# Patient Record
Sex: Female | Born: 1955 | Race: White | Hispanic: No | Marital: Married | Smoking: Never smoker
Health system: Southern US, Community
[De-identification: ages and names within clinical notes are randomized; demographics above are authoritative.]

## PROBLEM LIST (undated history)

## (undated) DIAGNOSIS — G4700 Insomnia, unspecified: Secondary | ICD-10-CM

## (undated) DIAGNOSIS — I341 Nonrheumatic mitral (valve) prolapse: Secondary | ICD-10-CM

## (undated) HISTORY — PX: SHOULDER SURGERY: SHX246

## (undated) HISTORY — DX: Insomnia, unspecified: G47.00

## (undated) HISTORY — DX: Nonrheumatic mitral (valve) prolapse: I34.1

## (undated) HISTORY — PX: BUNIONECTOMY: SHX129

---

## 2000-05-28 ENCOUNTER — Emergency Department (HOSPITAL_COMMUNITY): Admission: EM | Admit: 2000-05-28 | Discharge: 2000-05-28 | Payer: Self-pay | Admitting: Emergency Medicine

## 2004-02-09 ENCOUNTER — Ambulatory Visit: Payer: Self-pay

## 2005-02-15 ENCOUNTER — Ambulatory Visit: Payer: Self-pay

## 2006-02-27 ENCOUNTER — Ambulatory Visit: Payer: Self-pay

## 2007-03-05 ENCOUNTER — Ambulatory Visit: Payer: Self-pay

## 2007-12-25 ENCOUNTER — Ambulatory Visit: Payer: Self-pay

## 2008-03-09 ENCOUNTER — Ambulatory Visit: Payer: Self-pay

## 2009-03-10 ENCOUNTER — Ambulatory Visit: Payer: Self-pay

## 2010-03-14 ENCOUNTER — Ambulatory Visit: Payer: Self-pay | Admitting: Gastroenterology

## 2010-03-15 ENCOUNTER — Ambulatory Visit: Payer: Self-pay

## 2011-03-29 ENCOUNTER — Ambulatory Visit: Payer: Self-pay

## 2011-10-05 ENCOUNTER — Emergency Department: Payer: Self-pay | Admitting: Internal Medicine

## 2011-10-10 ENCOUNTER — Ambulatory Visit: Payer: Self-pay | Admitting: Internal Medicine

## 2015-04-08 ENCOUNTER — Other Ambulatory Visit: Payer: Self-pay | Admitting: Obstetrics and Gynecology

## 2015-04-08 ENCOUNTER — Inpatient Hospital Stay
Admission: RE | Admit: 2015-04-08 | Discharge: 2015-04-08 | Disposition: A | Discharge status: Home / Self Care | Payer: Self-pay | Source: Ambulatory Visit | Attending: *Deleted | Admitting: *Deleted

## 2015-04-08 ENCOUNTER — Other Ambulatory Visit: Payer: Self-pay | Admitting: *Deleted

## 2015-04-08 DIAGNOSIS — R928 Other abnormal and inconclusive findings on diagnostic imaging of breast: Secondary | ICD-10-CM

## 2015-04-08 DIAGNOSIS — Z9289 Personal history of other medical treatment: Secondary | ICD-10-CM

## 2015-04-22 ENCOUNTER — Ambulatory Visit
Admission: RE | Admit: 2015-04-22 | Discharge: 2015-04-22 | Disposition: A | Discharge status: Home / Self Care | Payer: 59 | Source: Ambulatory Visit | Attending: Obstetrics and Gynecology | Admitting: Obstetrics and Gynecology

## 2015-04-22 DIAGNOSIS — R928 Other abnormal and inconclusive findings on diagnostic imaging of breast: Secondary | ICD-10-CM | POA: Diagnosis not present

## 2016-02-15 ENCOUNTER — Other Ambulatory Visit: Payer: Self-pay | Admitting: Obstetrics and Gynecology

## 2016-02-15 DIAGNOSIS — Z1231 Encounter for screening mammogram for malignant neoplasm of breast: Secondary | ICD-10-CM

## 2016-04-12 ENCOUNTER — Ambulatory Visit
Admission: RE | Admit: 2016-04-12 | Discharge: 2016-04-12 | Disposition: A | Discharge status: Home / Self Care | Payer: 59 | Source: Ambulatory Visit | Attending: Obstetrics and Gynecology | Admitting: Obstetrics and Gynecology

## 2016-04-12 DIAGNOSIS — Z1231 Encounter for screening mammogram for malignant neoplasm of breast: Secondary | ICD-10-CM | POA: Insufficient documentation

## 2016-10-06 ENCOUNTER — Other Ambulatory Visit: Payer: Self-pay | Admitting: Internal Medicine

## 2016-10-06 DIAGNOSIS — I10 Essential (primary) hypertension: Secondary | ICD-10-CM | POA: Diagnosis not present

## 2016-10-06 DIAGNOSIS — I471 Supraventricular tachycardia: Secondary | ICD-10-CM | POA: Diagnosis not present

## 2016-10-06 DIAGNOSIS — W57XXXA Bitten or stung by nonvenomous insect and other nonvenomous arthropods, initial encounter: Secondary | ICD-10-CM | POA: Diagnosis not present

## 2016-10-06 DIAGNOSIS — Z7689 Persons encountering health services in other specified circumstances: Secondary | ICD-10-CM | POA: Diagnosis not present

## 2016-10-06 DIAGNOSIS — Z79899 Other long term (current) drug therapy: Secondary | ICD-10-CM | POA: Diagnosis not present

## 2016-10-06 DIAGNOSIS — K219 Gastro-esophageal reflux disease without esophagitis: Secondary | ICD-10-CM

## 2016-10-06 DIAGNOSIS — Z0000 Encounter for general adult medical examination without abnormal findings: Secondary | ICD-10-CM | POA: Diagnosis not present

## 2016-10-13 ENCOUNTER — Ambulatory Visit
Admission: RE | Admit: 2016-10-13 | Discharge: 2016-10-13 | Disposition: A | Discharge status: Home / Self Care | Payer: 59 | Source: Ambulatory Visit | Attending: Internal Medicine | Admitting: Internal Medicine

## 2016-10-13 DIAGNOSIS — K449 Diaphragmatic hernia without obstruction or gangrene: Secondary | ICD-10-CM | POA: Diagnosis not present

## 2016-10-13 DIAGNOSIS — K8020 Calculus of gallbladder without cholecystitis without obstruction: Secondary | ICD-10-CM | POA: Insufficient documentation

## 2016-10-13 DIAGNOSIS — K219 Gastro-esophageal reflux disease without esophagitis: Secondary | ICD-10-CM | POA: Diagnosis not present

## 2016-11-09 DIAGNOSIS — J301 Allergic rhinitis due to pollen: Secondary | ICD-10-CM | POA: Diagnosis not present

## 2016-11-09 DIAGNOSIS — J309 Allergic rhinitis, unspecified: Secondary | ICD-10-CM | POA: Diagnosis not present

## 2016-11-09 DIAGNOSIS — R05 Cough: Secondary | ICD-10-CM | POA: Diagnosis not present

## 2016-11-15 DIAGNOSIS — K219 Gastro-esophageal reflux disease without esophagitis: Secondary | ICD-10-CM | POA: Diagnosis not present

## 2016-11-15 DIAGNOSIS — R1310 Dysphagia, unspecified: Secondary | ICD-10-CM | POA: Diagnosis not present

## 2016-12-26 DIAGNOSIS — K449 Diaphragmatic hernia without obstruction or gangrene: Secondary | ICD-10-CM | POA: Diagnosis not present

## 2016-12-26 DIAGNOSIS — K210 Gastro-esophageal reflux disease with esophagitis: Secondary | ICD-10-CM | POA: Diagnosis not present

## 2016-12-26 DIAGNOSIS — K2980 Duodenitis without bleeding: Secondary | ICD-10-CM | POA: Diagnosis not present

## 2016-12-26 DIAGNOSIS — R1310 Dysphagia, unspecified: Secondary | ICD-10-CM | POA: Diagnosis not present

## 2017-01-16 DIAGNOSIS — Z23 Encounter for immunization: Secondary | ICD-10-CM | POA: Diagnosis not present

## 2017-02-12 DIAGNOSIS — K210 Gastro-esophageal reflux disease with esophagitis: Secondary | ICD-10-CM | POA: Diagnosis not present

## 2017-02-12 DIAGNOSIS — K2980 Duodenitis without bleeding: Secondary | ICD-10-CM | POA: Diagnosis not present

## 2017-02-14 DIAGNOSIS — R197 Diarrhea, unspecified: Secondary | ICD-10-CM | POA: Diagnosis not present

## 2017-02-14 DIAGNOSIS — Z91018 Allergy to other foods: Secondary | ICD-10-CM | POA: Diagnosis not present

## 2017-02-14 DIAGNOSIS — R1110 Vomiting, unspecified: Secondary | ICD-10-CM | POA: Diagnosis not present

## 2017-03-06 ENCOUNTER — Other Ambulatory Visit: Payer: Self-pay | Admitting: Obstetrics and Gynecology

## 2017-03-06 DIAGNOSIS — Z1231 Encounter for screening mammogram for malignant neoplasm of breast: Secondary | ICD-10-CM

## 2017-04-06 DIAGNOSIS — S99921A Unspecified injury of right foot, initial encounter: Secondary | ICD-10-CM | POA: Diagnosis not present

## 2017-04-11 ENCOUNTER — Encounter: Payer: Self-pay | Admitting: Obstetrics and Gynecology

## 2017-04-11 ENCOUNTER — Ambulatory Visit (INDEPENDENT_AMBULATORY_CARE_PROVIDER_SITE_OTHER): Payer: 59 | Admitting: Obstetrics and Gynecology

## 2017-04-11 VITALS — BP 130/84 | Ht 67.0 in | Wt 199.0 lb

## 2017-04-11 DIAGNOSIS — Z124 Encounter for screening for malignant neoplasm of cervix: Secondary | ICD-10-CM

## 2017-04-11 DIAGNOSIS — Z1151 Encounter for screening for human papillomavirus (HPV): Secondary | ICD-10-CM

## 2017-04-11 DIAGNOSIS — Z01419 Encounter for gynecological examination (general) (routine) without abnormal findings: Secondary | ICD-10-CM

## 2017-04-11 DIAGNOSIS — Z1231 Encounter for screening mammogram for malignant neoplasm of breast: Secondary | ICD-10-CM | POA: Diagnosis not present

## 2017-04-11 DIAGNOSIS — Z1239 Encounter for other screening for malignant neoplasm of breast: Secondary | ICD-10-CM

## 2017-04-11 NOTE — Patient Instructions (Signed)
I value your feedback and entrusting us with your care. If you get a [facility] patient survey, I would appreciate you taking the time to let us know about your experience today. Thank you! 

## 2017-04-11 NOTE — Progress Notes (Signed)
PCP: Marguarite ArbourSparks, Jeffrey D, MD   Chief Complaint  Patient presents with  . Gynecologic Exam    HPI:      [name] is a 61 y.o. No obstetric history on file. who LMP was No LMP recorded. Patient is postmenopausal., presents today for her annual examination.  Her menses are absent due to menopause. She does not have intermenstrual bleeding.  She does have occas vasomotor sx.  Sex activity: single partner, contraception - post menopausal status. She does not have vaginal dryness.  Last Pap: April 02, 2014  Results were: no abnormalities /neg HPV DNA.  Hx of STDs: none  Last mammogram: April 12, 2016  Results were: normal--routine follow-up in 12 months There is no FH of breast cancer. There is no FH of ovarian cancer. The patient does do self-breast exams.  Colonoscopy: colonoscopy 6 years ago without abnormalities. Repeat due after 10 years.   Tobacco use: The patient denies current or previous tobacco use. Alcohol use: social drinker Exercise: moderately active  She does get adequate calcium and Vitamin D in her diet.  Labs with PCP.  Past Medical History:  Diagnosis Date  . Insomnia   . Mitral valve prolapse     Past Surgical History:  Procedure Laterality Date  . BUNIONECTOMY    . SHOULDER SURGERY     arthroscopic    Family History  Problem Relation Age of Onset  . Osteoporosis Mother   . Breast cancer Neg Hx     Social History   Socioeconomic History  . Marital status: Married    Spouse name: Not on file  . Number of children: Not on file  . Years of education: Not on file  . Highest education level: Not on file  Social Needs  . Financial resource strain: Not on file  . Food insecurity - worry: Not on file  . Food insecurity - inability: Not on file  . Transportation needs - medical: Not on file  . Transportation needs - non-medical: Not on file  Occupational History  . Not on file  Tobacco Use  . Smoking status: Never Smoker  .  Smokeless tobacco: Never Used  Substance and Sexual Activity  . Alcohol use: Yes    Comment: occasional  . Drug use: No  . Sexual activity: Yes    Birth control/protection: Post-menopausal  Other Topics Concern  . Not on file  Social History Narrative  . Not on file    Current Meds  Medication Sig  . albuterol (PROVENTIL HFA) 108 (90 Base) MCG/ACT inhaler INHALE 1-2 PUFFS AS NEEDED EVERY 4-6 HOURS AS NEEDED FOR COUGH/WHEEZING  . Calcium Carbonate-Vitamin D3 (CALCIUM 600-D) 600-400 MG-UNIT TABS Take by mouth.  . Cholecalciferol (VITAMIN D3) 3000 units TABS Take by mouth.  Marland Kitchen. FLUCELVAX QUADRIVALENT 0.5 ML SUSY TO BE ADMINISTERED BY PHARMACIST FOR IMMUNIZATION  . fluticasone (FLONASE) 50 MCG/ACT nasal spray USE 1-2 SPRAYS IN EACH NOSTRIL ONCE A DAY  . folic acid (FOLVITE) 400 MCG tablet Take by mouth.  . metoprolol succinate (TOPROL-XL) 50 MG 24 hr tablet Take 50 mg by mouth.  . Multiple Vitamins-Minerals (MULTIVITAMIN ADULT PO) Take by mouth.  . pantoprazole (PROTONIX) 40 MG tablet Take 40 mg by mouth.  . ranitidine (ZANTAC) 300 MG tablet TAKE 1 TABLET BY MOUTH EVERY DAY AT NIGHT  . zolpidem (AMBIEN CR) 6.25 MG CR tablet       ROS:  Review of Systems  Constitutional: Negative for fatigue, fever and unexpected  weight change.  Respiratory: Negative for cough, shortness of breath and wheezing.   Cardiovascular: Negative for chest pain, palpitations and leg swelling.  Gastrointestinal: Negative for blood in stool, constipation, diarrhea, nausea and vomiting.  Endocrine: Negative for cold intolerance, heat intolerance and polyuria.  Genitourinary: Negative for dyspareunia, dysuria, flank pain, frequency, genital sores, hematuria, menstrual problem, pelvic pain, urgency, vaginal bleeding, vaginal discharge and vaginal pain.  Musculoskeletal: Negative for back pain, joint swelling and myalgias.  Skin: Negative for rash.  Neurological: Negative for dizziness, syncope, light-headedness,  numbness and headaches.  Hematological: Negative for adenopathy.  Psychiatric/Behavioral: Negative for agitation, confusion, sleep disturbance and suicidal ideas. The patient is not nervous/anxious.      Objective: BP 130/84   Ht 5\' 7"  (1.702 m)   Wt 199 lb (90.3 kg)   BMI 31.17 kg/m    Physical Exam  Constitutional: She is oriented to person, place, and time. She appears well-developed and well-nourished.  Genitourinary: Vagina normal and uterus normal. There is no rash or tenderness on the right labia. There is no rash or tenderness on the left labia. No erythema or tenderness in the vagina. No vaginal discharge found. Right adnexum does not display mass and does not display tenderness. Left adnexum does not display mass and does not display tenderness. Cervix does not exhibit motion tenderness or polyp. Uterus is not enlarged or tender.  Neck: Normal range of motion. No thyromegaly present.  Cardiovascular: Normal rate, regular rhythm and normal heart sounds.  No murmur heard. Pulmonary/Chest: Effort normal and breath sounds normal. Right breast exhibits no mass, no nipple discharge, no skin change and no tenderness. Left breast exhibits no mass, no nipple discharge, no skin change and no tenderness.  Abdominal: Soft. There is no tenderness. There is no guarding.  Musculoskeletal: Normal range of motion.  Neurological: She is alert and oriented to person, place, and time. No cranial nerve deficit.  Psychiatric: She has a normal mood and affect. Her behavior is normal.  Vitals reviewed.   Assessment/Plan:  Encounter for annual routine gynecological examination  Cervical cancer screening - Plan: IGP, Aptima HPV  Screening for HPV (human papillomavirus) - Plan: IGP, Aptima HPV  Screening for breast cancer - Pt has mammo sched 12/28. - Plan: MM SCREENING BREAST TOMO BILATERAL         GYN counsel breast self exam, mammography screening, adequate intake of calcium and vitamin D,  diet and exercise    F/U  Return in about 1 year (around 04/11/2018).  Alicia B. Copland, PA-C 04/11/2017 2:13 PM

## 2017-04-13 ENCOUNTER — Ambulatory Visit
Admission: RE | Admit: 2017-04-13 | Discharge: 2017-04-13 | Disposition: A | Discharge status: Home / Self Care | Payer: 59 | Source: Ambulatory Visit | Attending: Obstetrics and Gynecology | Admitting: Obstetrics and Gynecology

## 2017-04-13 DIAGNOSIS — Z1231 Encounter for screening mammogram for malignant neoplasm of breast: Secondary | ICD-10-CM

## 2017-04-13 LAB — IGP, APTIMA HPV
HPV APTIMA: NEGATIVE
PAP SMEAR COMMENT: 0

## 2017-04-15 ENCOUNTER — Encounter: Payer: Self-pay | Admitting: Obstetrics and Gynecology

## 2017-05-28 DIAGNOSIS — Z91018 Allergy to other foods: Secondary | ICD-10-CM | POA: Diagnosis not present

## 2017-06-08 DIAGNOSIS — L821 Other seborrheic keratosis: Secondary | ICD-10-CM | POA: Diagnosis not present

## 2017-06-12 DIAGNOSIS — J301 Allergic rhinitis due to pollen: Secondary | ICD-10-CM | POA: Diagnosis not present

## 2017-06-12 DIAGNOSIS — R05 Cough: Secondary | ICD-10-CM | POA: Diagnosis not present

## 2017-06-12 DIAGNOSIS — Z91030 Bee allergy status: Secondary | ICD-10-CM | POA: Diagnosis not present

## 2017-10-04 DIAGNOSIS — E7800 Pure hypercholesterolemia, unspecified: Secondary | ICD-10-CM | POA: Diagnosis not present

## 2017-10-04 DIAGNOSIS — I471 Supraventricular tachycardia: Secondary | ICD-10-CM | POA: Diagnosis not present

## 2017-10-04 DIAGNOSIS — I10 Essential (primary) hypertension: Secondary | ICD-10-CM | POA: Diagnosis not present

## 2017-10-05 DIAGNOSIS — Z79899 Other long term (current) drug therapy: Secondary | ICD-10-CM | POA: Diagnosis not present

## 2017-10-05 DIAGNOSIS — E7800 Pure hypercholesterolemia, unspecified: Secondary | ICD-10-CM | POA: Diagnosis not present

## 2017-10-05 DIAGNOSIS — I10 Essential (primary) hypertension: Secondary | ICD-10-CM | POA: Diagnosis not present

## 2017-10-09 DIAGNOSIS — Z13820 Encounter for screening for osteoporosis: Secondary | ICD-10-CM | POA: Diagnosis not present

## 2017-12-31 DIAGNOSIS — Z23 Encounter for immunization: Secondary | ICD-10-CM | POA: Diagnosis not present

## 2018-02-28 DIAGNOSIS — Z23 Encounter for immunization: Secondary | ICD-10-CM | POA: Diagnosis not present

## 2018-03-01 ENCOUNTER — Other Ambulatory Visit: Payer: Self-pay | Admitting: Obstetrics and Gynecology

## 2018-03-01 DIAGNOSIS — Z1231 Encounter for screening mammogram for malignant neoplasm of breast: Secondary | ICD-10-CM

## 2018-04-15 ENCOUNTER — Ambulatory Visit
Admission: RE | Admit: 2018-04-15 | Discharge: 2018-04-15 | Disposition: A | Discharge status: Home / Self Care | Payer: 59 | Source: Ambulatory Visit | Attending: Obstetrics and Gynecology | Admitting: Obstetrics and Gynecology

## 2018-04-15 DIAGNOSIS — Z1231 Encounter for screening mammogram for malignant neoplasm of breast: Secondary | ICD-10-CM | POA: Diagnosis present

## 2018-04-17 ENCOUNTER — Encounter: Payer: Self-pay | Admitting: Obstetrics and Gynecology

## 2018-04-18 ENCOUNTER — Encounter: Payer: Self-pay | Admitting: Obstetrics and Gynecology

## 2018-04-18 ENCOUNTER — Ambulatory Visit (INDEPENDENT_AMBULATORY_CARE_PROVIDER_SITE_OTHER): Payer: 59 | Admitting: Obstetrics and Gynecology

## 2018-04-18 VITALS — BP 120/80 | HR 64 | Ht 66.0 in | Wt 194.0 lb

## 2018-04-18 DIAGNOSIS — Z1239 Encounter for other screening for malignant neoplasm of breast: Secondary | ICD-10-CM

## 2018-04-18 DIAGNOSIS — K644 Residual hemorrhoidal skin tags: Secondary | ICD-10-CM

## 2018-04-18 DIAGNOSIS — Z01419 Encounter for gynecological examination (general) (routine) without abnormal findings: Secondary | ICD-10-CM

## 2018-04-18 MED ORDER — HYDROCORTISONE ACE-PRAMOXINE 2.5-1 % RE CREA: 1.0000 "application " | TOPICAL_CREAM | Freq: Two times a day (BID) | RECTAL | 0 refills | Status: AC | PRN

## 2018-04-18 NOTE — Progress Notes (Signed)
PCP: Marguarite Arbour, MD   Chief Complaint  Patient presents with  . Gynecologic Exam    has some hemorrhoids qs    HPI:      [name] is a 63 y.o. No obstetric history on file. who LMP was No LMP recorded. Patient is postmenopausal., presents today for her annual examination.  Her menses are absent due to menopause. She does not have intermenstrual bleeding. She does have tolerable vasomotor sx.  Sex activity: single partner, contraception - post menopausal status. She does not have vaginal dryness.  Last Pap: 04/11/17 Results were: no abnormalities /neg HPV DNA.  Hx of STDs: none  Last mammogram: 04/15/18  Results were: normal--routine follow-up in 12 months There is no FH of breast cancer. There is no FH of ovarian cancer. The patient does do self-breast exams.  Colonoscopy: colonoscopy 7 years ago without abnormalities. Repeat due after 10 years. Having issues with ext hemorrhoid flare. Sx started with holidays. No rectal bleeding. Using OTC meds without much relief. BMs back to normal, daily.   Tobacco use: The patient denies current or previous tobacco use. Alcohol use: social drinker Exercise: very active  She does get adequate calcium and Vitamin D in her diet.  Labs with PCP.  Past Medical History:  Diagnosis Date  . Insomnia   . Mitral valve prolapse     Past Surgical History:  Procedure Laterality Date  . BUNIONECTOMY    . SHOULDER SURGERY     arthroscopic    Family History  Problem Relation Age of Onset  . Osteoporosis Mother   . Breast cancer Neg Hx     Social History   Socioeconomic History  . Marital status: Married    Spouse name: Not on file  . Number of children: Not on file  . Years of education: Not on file  . Highest education level: Not on file  Occupational History  . Not on file  Social Needs  . Financial resource strain: Not on file  . Food insecurity:    Worry: Not on file    Inability: Not on file  .  Transportation needs:    Medical: Not on file    Non-medical: Not on file  Tobacco Use  . Smoking status: Never Smoker  . Smokeless tobacco: Never Used  Substance and Sexual Activity  . Alcohol use: Yes    Comment: occasional  . Drug use: No  . Sexual activity: Yes    Birth control/protection: Post-menopausal  Lifestyle  . Physical activity:    Days per week: 0 days    Minutes per session: 0 min  . Stress: Only a little  Relationships  . Social connections:    Talks on phone: More than three times a week    Gets together: More than three times a week    Attends religious service: Never    Active member of club or organization: Yes    Attends meetings of clubs or organizations: More than 4 times per year    Relationship status: Married  . Intimate partner violence:    Fear of current or ex partner: No    Emotionally abused: No    Physically abused: No    Forced sexual activity: No  Other Topics Concern  . Not on file  Social History Narrative  . Not on file    Current Meds  Medication Sig  . albuterol (PROVENTIL HFA) 108 (90 Base) MCG/ACT inhaler INHALE 1-2 PUFFS AS NEEDED  EVERY 4-6 HOURS AS NEEDED FOR COUGH/WHEEZING  . Calcium Carbonate-Vitamin D3 (CALCIUM 600-D) 600-400 MG-UNIT TABS Take by mouth.  . Cholecalciferol (VITAMIN D3) 3000 units TABS Take by mouth.  . fluticasone (FLONASE) 50 MCG/ACT nasal spray USE 1-2 SPRAYS IN EACH NOSTRIL ONCE A DAY  . folic acid (FOLVITE) 400 MCG tablet Take by mouth.  . ibandronate (BONIVA) 150 MG tablet Take by mouth.  . metoprolol succinate (TOPROL-XL) 50 MG 24 hr tablet Take 50 mg by mouth.  . Multiple Vitamins-Minerals (MULTIVITAMIN ADULT PO) Take by mouth.  . zolpidem (AMBIEN CR) 6.25 MG CR tablet       ROS:  Review of Systems  Constitutional: Negative for fatigue, fever and unexpected weight change.  Respiratory: Negative for cough, shortness of breath and wheezing.   Cardiovascular: Negative for chest pain, palpitations  and leg swelling.  Gastrointestinal: Negative for blood in stool, constipation, diarrhea, nausea and vomiting.  Endocrine: Negative for cold intolerance, heat intolerance and polyuria.  Genitourinary: Negative for dyspareunia, dysuria, flank pain, frequency, genital sores, hematuria, menstrual problem, pelvic pain, urgency, vaginal bleeding, vaginal discharge and vaginal pain.  Musculoskeletal: Negative for back pain, joint swelling and myalgias.  Skin: Negative for rash.  Neurological: Negative for dizziness, syncope, light-headedness, numbness and headaches.  Hematological: Negative for adenopathy.  Psychiatric/Behavioral: Negative for agitation, confusion, sleep disturbance and suicidal ideas. The patient is not nervous/anxious.      Objective: BP 120/80   Pulse 64   Ht 5\' 6"  (1.676 m)   Wt 194 lb (88 kg)   BMI 31.31 kg/m    Physical Exam Constitutional:      Appearance: She is well-developed.  Genitourinary:     Vagina, cervix and uterus normal.     No vaginal discharge, erythema or tenderness.     No cervical polyp.     Uterus is not enlarged or tender.     No right or left adnexal mass present.     Right adnexa not tender.     Left adnexa not tender.  Rectum:     External hemorrhoid present.  Neck:     Musculoskeletal: Normal range of motion.     Thyroid: No thyromegaly.  Cardiovascular:     Rate and Rhythm: Normal rate and regular rhythm.     Heart sounds: Normal heart sounds. No murmur.  Pulmonary:     Effort: Pulmonary effort is normal.     Breath sounds: Normal breath sounds.  Chest:     Breasts:        Right: No mass, nipple discharge, skin change or tenderness.        Left: No mass, nipple discharge, skin change or tenderness.  Abdominal:     Palpations: Abdomen is soft.     Tenderness: There is no abdominal tenderness. There is no guarding.  Musculoskeletal: Normal range of motion.  Neurological:     Mental Status: She is alert and oriented to person,  place, and time.     Cranial Nerves: No cranial nerve deficit.  Psychiatric:        Behavior: Behavior normal.  Vitals signs reviewed.     Assessment/Plan:  Encounter for annual routine gynecological examination  Screening for breast cancer - Pt current on mammo  External hemorrhoids - Flare with holidays. No rectal bleeding. Rx analpram. Keep stools soft. F/u prn - Plan: hydrocortisone-pramoxine (ANALPRAM-HC) 2.5-1 % rectal cream         GYN counsel breast self exam, mammography screening, adequate intake of  calcium and vitamin D, diet and exercise    F/U  Return in about 1 year (around 04/19/2019).  Alicia B. Copland, PA-C 04/18/2018 4:29 PM

## 2018-04-18 NOTE — Patient Instructions (Signed)
I value your feedback and entrusting us with your care. If you get a [facility] patient survey, I would appreciate you taking the time to let us know about your experience today. Thank you! 

## 2018-05-16 DIAGNOSIS — J301 Allergic rhinitis due to pollen: Secondary | ICD-10-CM | POA: Diagnosis not present

## 2018-05-16 DIAGNOSIS — Z91030 Bee allergy status: Secondary | ICD-10-CM | POA: Diagnosis not present

## 2018-05-16 DIAGNOSIS — R05 Cough: Secondary | ICD-10-CM | POA: Diagnosis not present

## 2018-06-12 DIAGNOSIS — Z23 Encounter for immunization: Secondary | ICD-10-CM | POA: Diagnosis not present

## 2018-11-27 ENCOUNTER — Other Ambulatory Visit: Payer: Self-pay | Admitting: Internal Medicine

## 2018-11-27 ENCOUNTER — Other Ambulatory Visit: Payer: Self-pay | Admitting: Obstetrics and Gynecology

## 2018-11-27 DIAGNOSIS — Z1231 Encounter for screening mammogram for malignant neoplasm of breast: Secondary | ICD-10-CM

## 2019-04-17 ENCOUNTER — Ambulatory Visit
Admission: RE | Admit: 2019-04-17 | Discharge: 2019-04-17 | Disposition: A | Discharge status: Home / Self Care | Payer: 59 | Source: Ambulatory Visit | Attending: Obstetrics and Gynecology | Admitting: Obstetrics and Gynecology

## 2019-04-17 DIAGNOSIS — Z1231 Encounter for screening mammogram for malignant neoplasm of breast: Secondary | ICD-10-CM | POA: Insufficient documentation

## 2019-04-18 ENCOUNTER — Encounter: Payer: Self-pay | Admitting: Obstetrics and Gynecology

## 2019-07-04 ENCOUNTER — Ambulatory Visit: Payer: 59 | Admitting: Obstetrics and Gynecology

## 2019-07-30 NOTE — Progress Notes (Signed)
PCP: Marguarite Arbour, MD   Chief Complaint  Patient presents with  . Gynecologic Exam    HPI:      [name] is a 64 y.o. No obstetric history on file. who LMP was No LMP recorded. Patient is postmenopausal., presents today for her annual examination.  Her menses are absent due to menopause. She does not have intermenstrual bleeding. She does not have vasomotor sx.  Sex activity: single partner, contraception - post menopausal status. She does not have vaginal dryness.  Last Pap: 04/11/17 Results were: no abnormalities /neg HPV DNA.  Hx of STDs: none  Last mammogram: 04/17/19  Results were: normal--routine follow-up in 12 months There is no FH of breast cancer. There is no FH of ovarian cancer. The patient does do self-breast exams.  Colonoscopy: colonoscopy 8 years ago without abnormalities. Repeat due after 10 years. Hx of hemorrhoids.  Tobacco use: The patient denies current or previous tobacco use. Alcohol use: social drinker  No drug use Exercise: very active  She does get adequate calcium and Vitamin D in her diet.  Labs with PCP.  Past Medical History:  Diagnosis Date  . Insomnia   . Mitral valve prolapse     Past Surgical History:  Procedure Laterality Date  . BUNIONECTOMY    . SHOULDER SURGERY     arthroscopic    Family History  Problem Relation Age of Onset  . Osteoporosis Mother   . Breast cancer Neg Hx     Social History   Socioeconomic History  . Marital status: Married    Spouse name: Not on file  . Number of children: Not on file  . Years of education: Not on file  . Highest education level: Not on file  Occupational History  . Not on file  Tobacco Use  . Smoking status: Never Smoker  . Smokeless tobacco: Never Used  Substance and Sexual Activity  . Alcohol use: Yes    Comment: occasional  . Drug use: No  . Sexual activity: Yes    Birth control/protection: Post-menopausal  Other Topics Concern  . Not on file   Social History Narrative  . Not on file   Social Determinants of Health   Financial Resource Strain:   . Difficulty of Paying Living Expenses:   Food Insecurity:   . Worried About Programme researcher, broadcasting/film/video in the Last Year:   . Barista in the Last Year:   Transportation Needs:   . Freight forwarder (Medical):   Marland Kitchen Lack of Transportation (Non-Medical):   Physical Activity:   . Days of Exercise per Week:   . Minutes of Exercise per Session:   Stress:   . Feeling of Stress :   Social Connections:   . Frequency of Communication with Friends and Family:   . Frequency of Social Gatherings with Friends and Family:   . Attends Religious Services:   . Active Member of Clubs or Organizations:   . Attends Banker Meetings:   Marland Kitchen Marital Status:   Intimate Partner Violence:   . Fear of Current or Ex-Partner:   . Emotionally Abused:   Marland Kitchen Physically Abused:   . Sexually Abused:     Current Meds  Medication Sig  . albuterol (PROVENTIL HFA) 108 (90 Base) MCG/ACT inhaler INHALE 1-2 PUFFS AS NEEDED EVERY 4-6 HOURS AS NEEDED FOR COUGH/WHEEZING  . BREO ELLIPTA 100-25 MCG/INH AEPB Inhale 1 puff into the lungs daily.  . Calcium  Carbonate-Vitamin D3 (CALCIUM 600-D) 600-400 MG-UNIT TABS Take by mouth.  . cetirizine (ZYRTEC) 10 MG tablet Take by mouth.  . Cholecalciferol (VITAMIN D3) 3000 units TABS Take by mouth.  . famotidine (PEPCID) 40 MG tablet TAKE 1 TABLET BY MOUTH EVERYDAY AT BEDTIME  . fluticasone (FLONASE) 50 MCG/ACT nasal spray USE 1-2 SPRAYS IN EACH NOSTRIL ONCE A DAY  . folic acid (FOLVITE) 400 MCG tablet Take by mouth.  . ibandronate (BONIVA) 150 MG tablet TAKE 1 TABLET BY MOUTH EVERY 30 DAYS  . metoprolol succinate (TOPROL-XL) 50 MG 24 hr tablet Take 50 mg by mouth.  . montelukast (SINGULAIR) 10 MG tablet Take by mouth.  . Multiple Vitamins-Minerals (MULTIVITAMIN ADULT PO) Take by mouth.  . zolpidem (AMBIEN CR) 6.25 MG CR tablet       ROS:  Review of  Systems  Constitutional: Negative for fatigue, fever and unexpected weight change.  Respiratory: Negative for cough, shortness of breath and wheezing.   Cardiovascular: Negative for chest pain, palpitations and leg swelling.  Gastrointestinal: Negative for blood in stool, constipation, diarrhea, nausea and vomiting.  Endocrine: Negative for cold intolerance, heat intolerance and polyuria.  Genitourinary: Negative for dyspareunia, dysuria, flank pain, frequency, genital sores, hematuria, menstrual problem, pelvic pain, urgency, vaginal bleeding, vaginal discharge and vaginal pain.  Musculoskeletal: Negative for back pain, joint swelling and myalgias.  Skin: Negative for rash.  Neurological: Negative for dizziness, syncope, light-headedness, numbness and headaches.  Hematological: Negative for adenopathy.  Psychiatric/Behavioral: Negative for agitation, confusion, sleep disturbance and suicidal ideas. The patient is not nervous/anxious.      Objective: BP 100/70   Ht 5\' 6"  (1.676 m)   Wt 195 lb (88.5 kg)   BMI 31.47 kg/m    Physical Exam Constitutional:      Appearance: She is well-developed.  Genitourinary:     Vulva, vagina, cervix, uterus, right adnexa and left adnexa normal.     No vulval lesion or tenderness noted.     No vaginal discharge, erythema or tenderness.     No cervical polyp.     Uterus is not enlarged or tender.     No right or left adnexal mass present.     Right adnexa not tender.     Left adnexa not tender.  Rectum:     External hemorrhoid present.  Neck:     Thyroid: No thyromegaly.  Cardiovascular:     Rate and Rhythm: Normal rate and regular rhythm.     Heart sounds: Normal heart sounds. No murmur.  Pulmonary:     Effort: Pulmonary effort is normal.     Breath sounds: Normal breath sounds.  Chest:     Breasts:        Right: No mass, nipple discharge, skin change or tenderness.        Left: No mass, nipple discharge, skin change or tenderness.   Abdominal:     Palpations: Abdomen is soft.     Tenderness: There is no abdominal tenderness. There is no guarding.  Musculoskeletal:        General: Normal range of motion.     Cervical back: Normal range of motion.  Neurological:     General: No focal deficit present.     Mental Status: She is alert and oriented to person, place, and time.     Cranial Nerves: No cranial nerve deficit.  Skin:    General: Skin is warm and dry.  Psychiatric:        Mood and  Affect: Mood normal.        Behavior: Behavior normal.        Thought Content: Thought content normal.        Judgment: Judgment normal.  Vitals reviewed.     Assessment/Plan:  Encounter for annual routine gynecological examination  Encounter for screening mammogram for malignant neoplasm of breast - Plan: MM 3D SCREEN BREAST BILATERAL; pt to sched mammo         GYN counsel breast self exam, mammography screening, adequate intake of calcium and vitamin D, diet and exercise    F/U  Return in about 1 year (around 07/30/2020).  Bindi Klomp B. Odessie Polzin, PA-C 07/31/2019 9:16 AM

## 2019-07-31 ENCOUNTER — Other Ambulatory Visit: Payer: Self-pay

## 2019-07-31 ENCOUNTER — Encounter: Payer: Self-pay | Admitting: Obstetrics and Gynecology

## 2019-07-31 ENCOUNTER — Ambulatory Visit (INDEPENDENT_AMBULATORY_CARE_PROVIDER_SITE_OTHER): Payer: 59 | Admitting: Obstetrics and Gynecology

## 2019-07-31 VITALS — BP 100/70 | Ht 66.0 in | Wt 195.0 lb

## 2019-07-31 DIAGNOSIS — Z1231 Encounter for screening mammogram for malignant neoplasm of breast: Secondary | ICD-10-CM | POA: Diagnosis not present

## 2019-07-31 DIAGNOSIS — Z01419 Encounter for gynecological examination (general) (routine) without abnormal findings: Secondary | ICD-10-CM

## 2019-07-31 NOTE — Patient Instructions (Signed)
I value your feedback and entrusting us with your care. If you get a [facility] patient survey, I would appreciate you taking the time to let us know about your experience today. Thank you!  As of March 27, 2019, your lab results will be released to your MyChart immediately, before I even have a chance to see them. Please give me time to review them and contact you if there are any abnormalities. Thank you for your patience.  

## 2020-04-15 ENCOUNTER — Encounter: Payer: Self-pay | Admitting: Obstetrics and Gynecology

## 2020-04-15 ENCOUNTER — Other Ambulatory Visit: Payer: Self-pay

## 2020-04-15 ENCOUNTER — Ambulatory Visit
Admission: RE | Admit: 2020-04-15 | Discharge: 2020-04-15 | Disposition: A | Discharge status: Home / Self Care | Payer: 59 | Source: Ambulatory Visit | Attending: Obstetrics and Gynecology | Admitting: Obstetrics and Gynecology

## 2020-04-15 DIAGNOSIS — Z1231 Encounter for screening mammogram for malignant neoplasm of breast: Secondary | ICD-10-CM | POA: Diagnosis not present

## 2020-08-01 NOTE — Progress Notes (Signed)
PCP: Marguarite Arbour, MD   Chief Complaint  Patient presents with  . Gynecologic Exam    No concerns    HPI:      Stephanie Morris is a 65 y.o. No obstetric history on file. who LMP was No LMP recorded. Patient is postmenopausal., presents today for her annual examination.  Her menses are absent due to menopause. She does not have PMB, no vasomotor sx.  Sex activity: single partner, contraception - post menopausal status. She does not have vaginal dryness.  Last Pap: 04/11/17 Results were: no abnormalities /neg HPV DNA.  Hx of STDs: none  Last mammogram: 04/15/20  Results were: normal--routine follow-up in 12 months There is no FH of breast cancer. There is no FH of ovarian cancer. The patient does do self-breast exams.  Colonoscopy: colonoscopy 11/21 without abnormalities. Repeat due after 10 years. Hx of hemorrhoids. DEXA: with PCP, on boniva. FH osteoporosis in mom  Tobacco use: The patient denies current or previous tobacco use. Alcohol use: social drinker  No drug use Exercise: very active  She does get adequate calcium and Vitamin D in her diet.  Labs with PCP.  Past Medical History:  Diagnosis Date  . Insomnia   . Mitral valve prolapse     Past Surgical History:  Procedure Laterality Date  . BUNIONECTOMY    . SHOULDER SURGERY     arthroscopic    Family History  Problem Relation Age of Onset  . Osteoporosis Mother   . Breast cancer Neg Hx     Social History   Socioeconomic History  . Marital status: Married    Spouse name: Not on file  . Number of children: Not on file  . Years of education: Not on file  . Highest education level: Not on file  Occupational History  . Not on file  Tobacco Use  . Smoking status: Never Smoker  . Smokeless tobacco: Never Used  Vaping Use  . Vaping Use: Never used  Substance and Sexual Activity  . Alcohol use: Yes    Comment: occasional  . Drug use: No  . Sexual activity: Yes    Birth  control/protection: Post-menopausal  Other Topics Concern  . Not on file  Social History Narrative  . Not on file   Social Determinants of Health   Financial Resource Strain: Not on file  Food Insecurity: Not on file  Transportation Needs: Not on file  Physical Activity: Not on file  Stress: Not on file  Social Connections: Not on file  Intimate Partner Violence: Not on file    Current Meds  Medication Sig  . albuterol (VENTOLIN HFA) 108 (90 Base) MCG/ACT inhaler INHALE 1-2 PUFFS AS NEEDED EVERY 4-6 HOURS AS NEEDED FOR COUGH/WHEEZING  . Calcium Carbonate-Vitamin D3 600-400 MG-UNIT TABS Take by mouth.  . cetirizine (ZYRTEC) 10 MG tablet Take by mouth.  . Cholecalciferol (VITAMIN D3) 3000 units TABS Take by mouth.  . EPINEPHrine 0.3 mg/0.3 mL IJ SOAJ injection   . famotidine (PEPCID) 40 MG tablet TAKE 1 TABLET BY MOUTH EVERYDAY AT BEDTIME  . fluticasone (FLONASE) 50 MCG/ACT nasal spray USE 1-2 SPRAYS IN EACH NOSTRIL ONCE A DAY  . folic acid (FOLVITE) 400 MCG tablet Take by mouth.  . ibandronate (BONIVA) 150 MG tablet TAKE 1 TABLET BY MOUTH EVERY 30 DAYS  . ipratropium-albuterol (DUONEB) 0.5-2.5 (3) MG/3ML SOLN Take by nebulization.  . metoprolol succinate (TOPROL-XL) 50 MG 24 hr tablet Take 50 mg by mouth.  Marland Kitchen  montelukast (SINGULAIR) 10 MG tablet Take by mouth.  . Multiple Vitamins-Minerals (MULTIVITAMIN ADULT PO) Take by mouth.  . Spacer/Aero-Holding [facility] DIAMOND) MISC   . SYMBICORT 160-4.5 MCG/ACT inhaler Inhale into the lungs.  Marland Kitchen zolpidem (AMBIEN CR) 6.25 MG CR tablet       ROS:  Review of Systems  Constitutional: Negative for fatigue, fever and unexpected weight change.  Respiratory: Negative for cough, shortness of breath and wheezing.   Cardiovascular: Negative for chest pain, palpitations and leg swelling.  Gastrointestinal: Negative for blood in stool, constipation, diarrhea, nausea and vomiting.  Endocrine: Negative for cold intolerance, heat  intolerance and polyuria.  Genitourinary: Negative for dyspareunia, dysuria, flank pain, frequency, genital sores, hematuria, menstrual problem, pelvic pain, urgency, vaginal bleeding, vaginal discharge and vaginal pain.  Musculoskeletal: Negative for back pain, joint swelling and myalgias.  Skin: Negative for rash.  Neurological: Negative for dizziness, syncope, light-headedness, numbness and headaches.  Hematological: Negative for adenopathy.  Psychiatric/Behavioral: Negative for agitation, confusion, sleep disturbance and suicidal ideas. The patient is not nervous/anxious.      Objective: BP 140/80   Ht 5\' 6"  (1.676 m)   Wt 189 lb (85.7 kg)   BMI 30.51 kg/m    Physical Exam Constitutional:      Appearance: She is well-developed.  Genitourinary:     Vulva normal.     Right Labia: No rash, tenderness or lesions.    Left Labia: No tenderness, lesions or rash.    No vaginal discharge, erythema or tenderness.      Right Adnexa: not tender and no mass present.    Left Adnexa: not tender and no mass present.    No cervical friability or polyp.     Uterus is not enlarged or tender.  Rectum:     External hemorrhoid present.  Breasts:     Right: No mass, nipple discharge, skin change or tenderness.     Left: No mass, nipple discharge, skin change or tenderness.    Neck:     Thyroid: No thyromegaly.  Cardiovascular:     Rate and Rhythm: Normal rate and regular rhythm.     Heart sounds: Normal heart sounds. No murmur heard.   Pulmonary:     Effort: Pulmonary effort is normal.     Breath sounds: Normal breath sounds.  Abdominal:     Palpations: Abdomen is soft.     Tenderness: There is no abdominal tenderness. There is no guarding or rebound.  Musculoskeletal:        General: Normal range of motion.     Cervical back: Normal range of motion.  Lymphadenopathy:     Cervical: No cervical adenopathy.  Neurological:     General: No focal deficit present.     Mental Status:  She is alert and oriented to person, place, and time.     Cranial Nerves: No cranial nerve deficit.  Skin:    General: Skin is warm and dry.  Psychiatric:        Mood and Affect: Mood normal.        Behavior: Behavior normal.        Thought Content: Thought content normal.        Judgment: Judgment normal.  Vitals reviewed.     Assessment/Plan: Encounter for annual routine gynecological examination  Encounter for screening mammogram for malignant neoplasm of breast - Plan: MM 3D SCREEN BREAST BILATERAL; pt to sched mammo 12/22        GYN counsel breast self exam,  mammography screening, adequate intake of calcium and vitamin D, diet and exercise    F/U  Return in about 1 year (around 08/02/2021).  Diannie Willner B. Tahira Olivarez, PA-C 08/02/2020 10:21 AM

## 2020-08-02 ENCOUNTER — Ambulatory Visit (INDEPENDENT_AMBULATORY_CARE_PROVIDER_SITE_OTHER): Payer: 59 | Admitting: Obstetrics and Gynecology

## 2020-08-02 ENCOUNTER — Other Ambulatory Visit: Payer: Self-pay

## 2020-08-02 ENCOUNTER — Encounter: Payer: Self-pay | Admitting: Obstetrics and Gynecology

## 2020-08-02 VITALS — BP 140/80 | Ht 66.0 in | Wt 189.0 lb

## 2020-08-02 DIAGNOSIS — Z01419 Encounter for gynecological examination (general) (routine) without abnormal findings: Secondary | ICD-10-CM | POA: Diagnosis not present

## 2020-08-02 DIAGNOSIS — Z1231 Encounter for screening mammogram for malignant neoplasm of breast: Secondary | ICD-10-CM | POA: Diagnosis not present

## 2020-08-02 NOTE — Patient Instructions (Signed)
I value your feedback and you entrusting us with your care. If you get a [facility] patient survey, I would appreciate you taking the time to let us know about your experience today. Thank you! ? ? ?

## 2020-09-19 IMAGING — MG DIGITAL SCREENING BILATERAL MAMMOGRAM WITH TOMO AND CAD
8 series · 8 of 24 positions shown · non-contrast
Comparison: Previous exam(s).

ACR Breast Density Category a: The breast tissue is almost entirely
fatty.

CLINICAL DATA: Screening.

EXAM:
DIGITAL SCREENING BILATERAL MAMMOGRAM WITH TOMO AND CAD

[R CC synth-2D]
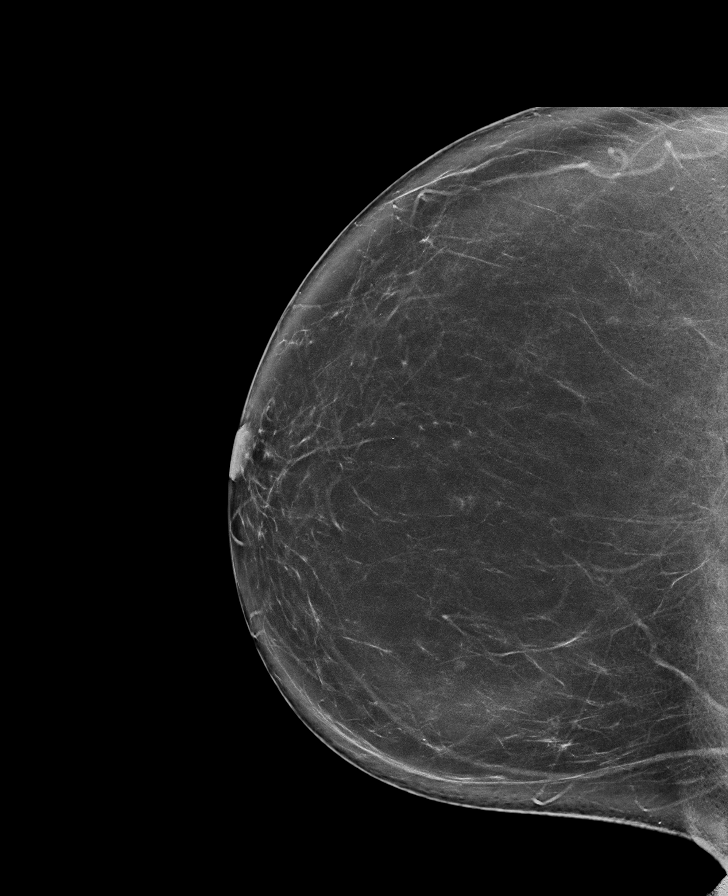

[R MLO synth-2D]
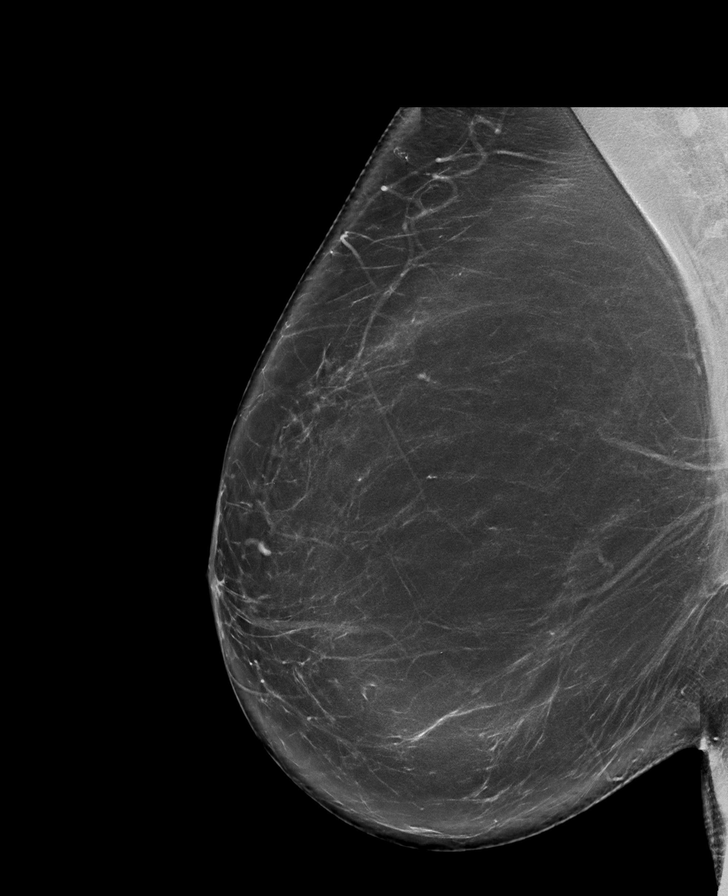

[L MLO synth-2D]
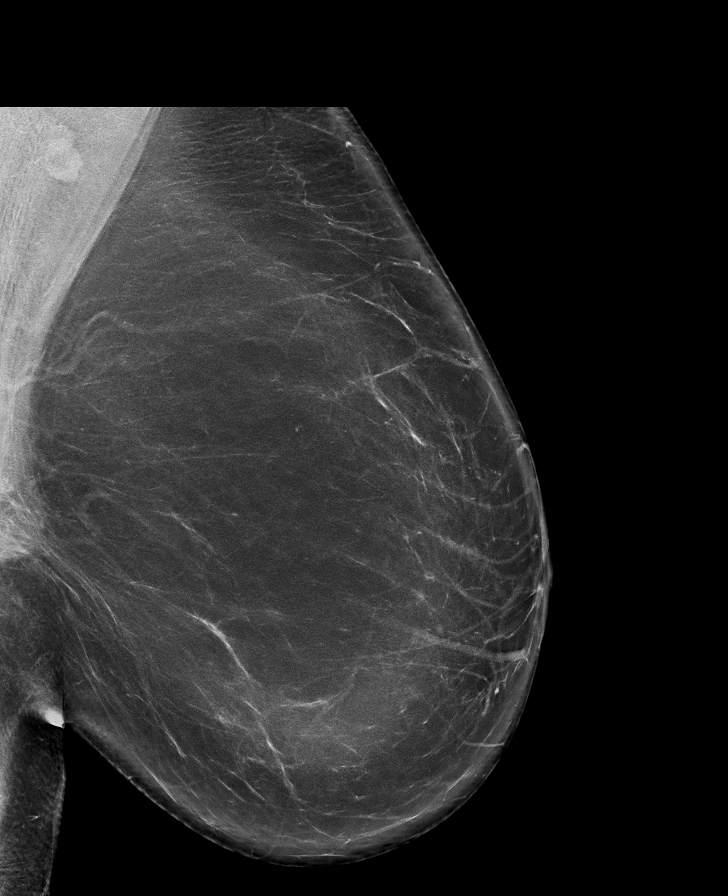

[L CC synth-2D]
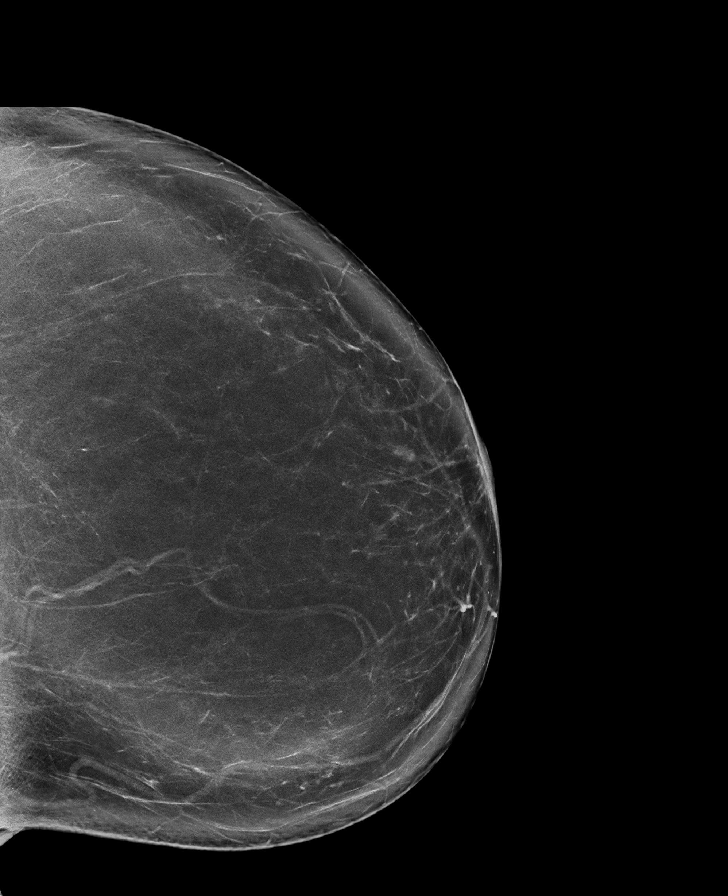

[R MLO tomo · tomo slice 50/99.0]
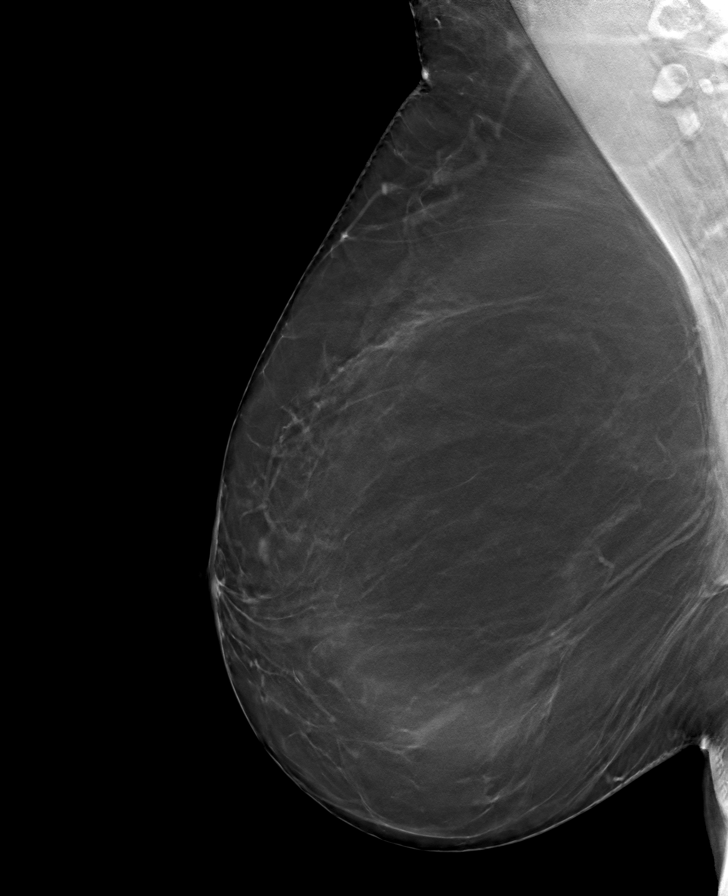

[L MLO tomo · tomo slice 52/103.0]
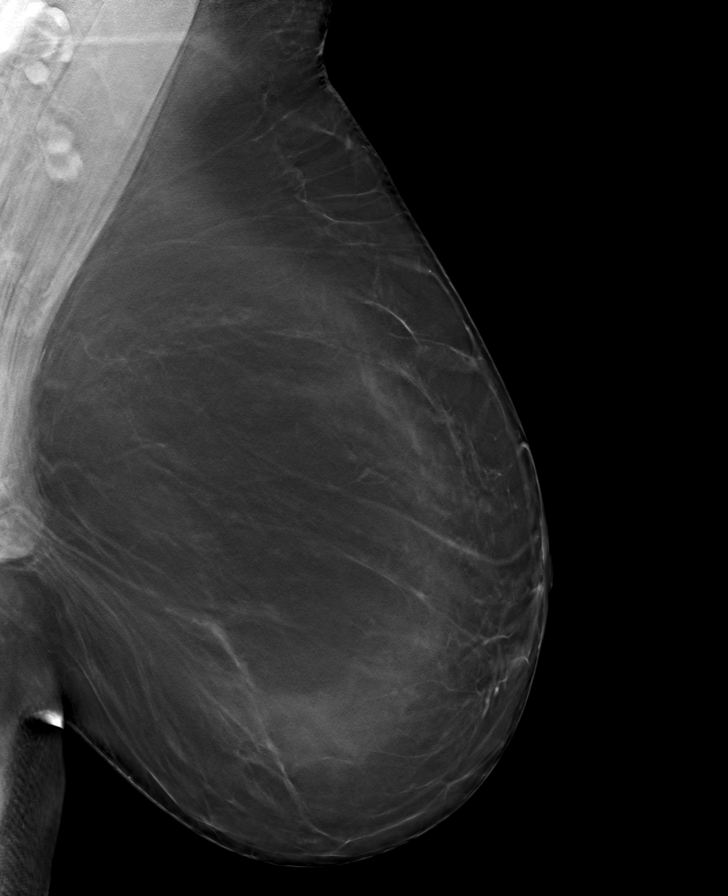

[R CC tomo · tomo slice 45/88.0]
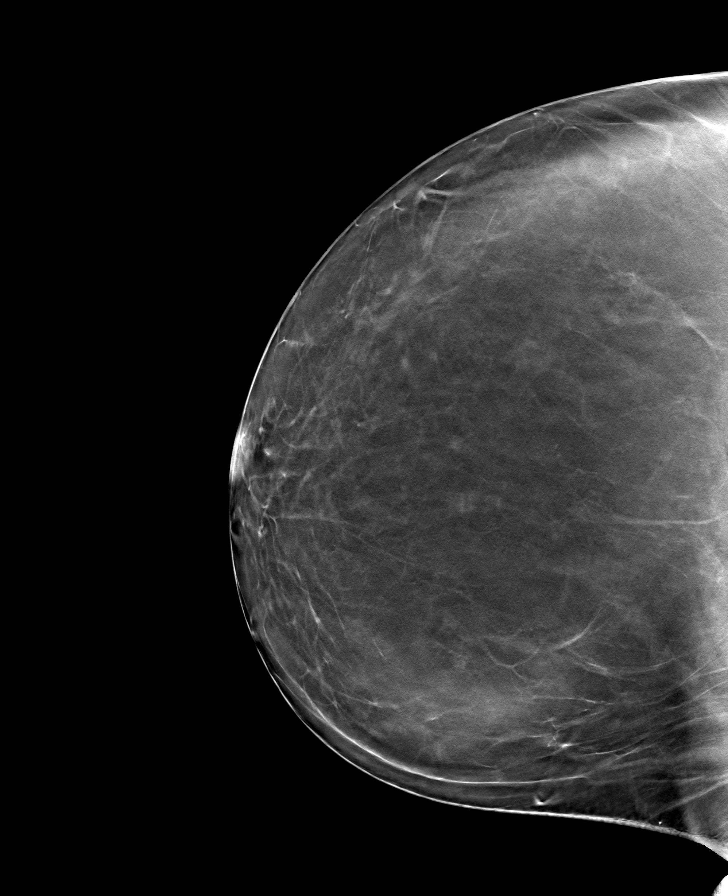

[L CC tomo · tomo slice 47/94.0]
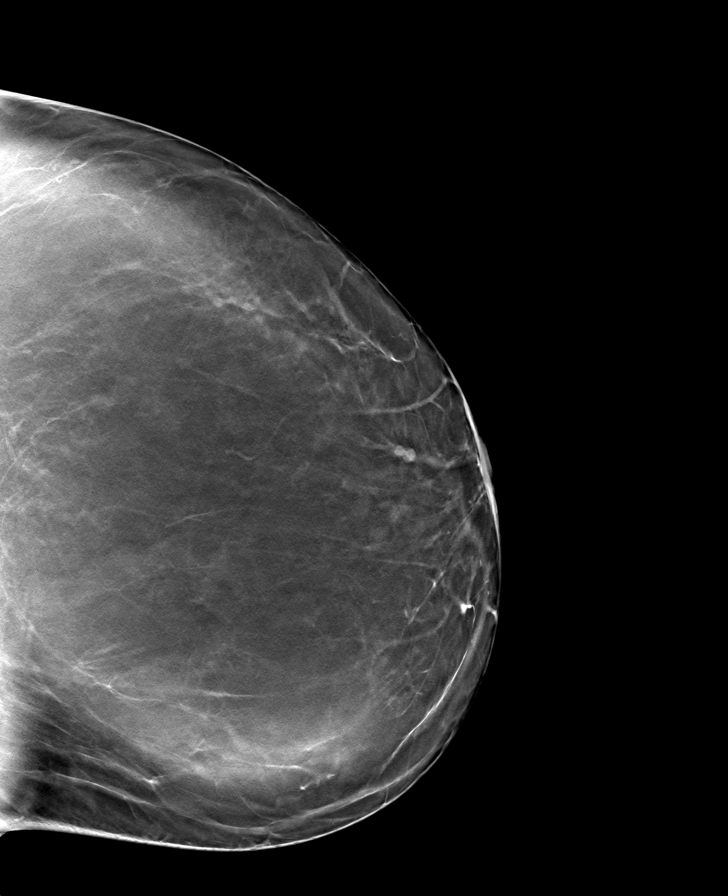

[8 of 24 positions shown; findings below may reference images not displayed]

FINDINGS: There are no findings suspicious for malignancy. Images were
processed with CAD.
IMPRESSION: No mammographic evidence of malignancy. A result letter of this
screening mammogram will be mailed directly to the patient.

RECOMMENDATION:
Screening mammogram in one year. (Code:[id number]-[id number])

BI-RADS CATEGORY  1: Negative.

## 2021-05-27 ENCOUNTER — Ambulatory Visit
Admission: RE | Admit: 2021-05-27 | Discharge: 2021-05-27 | Disposition: A | Discharge status: Home / Self Care | Payer: 59 | Source: Ambulatory Visit | Attending: Obstetrics and Gynecology | Admitting: Obstetrics and Gynecology

## 2021-05-27 ENCOUNTER — Other Ambulatory Visit: Payer: Self-pay

## 2021-05-27 DIAGNOSIS — Z1231 Encounter for screening mammogram for malignant neoplasm of breast: Secondary | ICD-10-CM | POA: Diagnosis not present

## 2021-05-28 ENCOUNTER — Encounter: Payer: Self-pay | Admitting: Obstetrics and Gynecology

## 2021-06-10 DIAGNOSIS — D2261 Melanocytic nevi of right upper limb, including shoulder: Secondary | ICD-10-CM | POA: Diagnosis not present

## 2021-06-10 DIAGNOSIS — D225 Melanocytic nevi of trunk: Secondary | ICD-10-CM | POA: Diagnosis not present

## 2021-06-10 DIAGNOSIS — D2262 Melanocytic nevi of left upper limb, including shoulder: Secondary | ICD-10-CM | POA: Diagnosis not present

## 2021-06-10 DIAGNOSIS — L820 Inflamed seborrheic keratosis: Secondary | ICD-10-CM | POA: Diagnosis not present

## 2021-06-10 DIAGNOSIS — L538 Other specified erythematous conditions: Secondary | ICD-10-CM | POA: Diagnosis not present

## 2021-06-10 DIAGNOSIS — L570 Actinic keratosis: Secondary | ICD-10-CM | POA: Diagnosis not present

## 2021-08-02 DIAGNOSIS — E7889 Other lipoprotein metabolism disorders: Secondary | ICD-10-CM | POA: Diagnosis not present

## 2021-10-25 ENCOUNTER — Encounter: Payer: Self-pay | Admitting: Obstetrics & Gynecology

## 2021-10-25 ENCOUNTER — Other Ambulatory Visit (HOSPITAL_COMMUNITY)
Admission: RE | Admit: 2021-10-25 | Discharge: 2021-10-25 | Disposition: A | Discharge status: Home / Self Care | Payer: 59 | Source: Ambulatory Visit | Attending: Obstetrics & Gynecology | Admitting: Obstetrics & Gynecology

## 2021-10-25 ENCOUNTER — Ambulatory Visit: Payer: 59 | Admitting: Obstetrics & Gynecology

## 2021-10-25 VITALS — BP 120/80 | Ht 67.0 in | Wt 184.0 lb

## 2021-10-25 DIAGNOSIS — Z124 Encounter for screening for malignant neoplasm of cervix: Secondary | ICD-10-CM

## 2021-10-25 DIAGNOSIS — N816 Rectocele: Secondary | ICD-10-CM | POA: Diagnosis not present

## 2021-10-25 DIAGNOSIS — N812 Incomplete uterovaginal prolapse: Secondary | ICD-10-CM

## 2021-10-25 NOTE — Progress Notes (Signed)
   Established Patient Office Visit  Subjective   Patient ID: Stephanie Morris, female    DOB: 09/27/1955  Age: 66 y.o. MRN: [id number]  Chief Complaint  Patient presents with   Vaginal Exam    Vag swelling    This  66 yo married P3 is here with a 4 week h/o vaginal pressure and discomfort. She has also noted seeing a "swelling" in her vagina for the last month. She denies any chronic urinary or fecal incontinence. She is sexually active. All of her rapid deliveries were vaginal.  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    Objective:     BP 120/80   Ht 5\' 7"  (1.702 m)   Wt 184 lb (83.5 kg)   BMI 28.82 kg/m    Physical Exam Well nourished, well hydrated White female, no apparent distress She is ambulating and conversing normally.  Genitourinary:             External: Normal external female genitalia.  Normal urethral meatus, normal Bartholin's and Skene's glands.  Grade 2 cystocele, rectocele, and uterine prolapse             Vagina: Normal vaginal mucosa             Cervix: Grossly normal in appearance, no bleeding             Uterus: Non-enlarged, mobile, normal contour.  No CMT             Adnexa: not palpable, no tenderness             Rectal: deferred  Based on her wide pubic arch, I will probably need a # 5, 6, or 7 ring.   No results found for any visits on 10/25/21.    The ASCVD Risk score (Arnett DK, et al., 2019) failed to calculate for the following reasons:   Cannot find a previous HDL lab   Cannot find a previous total cholesterol lab    Assessment  & Plan:   Problem List Items Addressed This Visit     Screening for cervical cancer - Primary   Relevant Orders   Cytology - PAP( [facility])   Rectocele   Cystocele with second degree uterine prolapse    Return today (on 10/25/2021) for pessary fitting when pessary is here.    12/26/2021, MD

## 2021-10-27 LAB — CYTOLOGY - PAP
Comment: NEGATIVE
Diagnosis: NEGATIVE
High risk HPV: NEGATIVE

## 2021-10-29 ENCOUNTER — Encounter: Payer: Self-pay | Admitting: Obstetrics & Gynecology

## 2021-11-02 DIAGNOSIS — I341 Nonrheumatic mitral (valve) prolapse: Secondary | ICD-10-CM | POA: Diagnosis not present

## 2021-11-02 DIAGNOSIS — E7800 Pure hypercholesterolemia, unspecified: Secondary | ICD-10-CM | POA: Diagnosis not present

## 2021-11-02 DIAGNOSIS — I10 Essential (primary) hypertension: Secondary | ICD-10-CM | POA: Diagnosis not present

## 2021-11-02 DIAGNOSIS — J4520 Mild intermittent asthma, uncomplicated: Secondary | ICD-10-CM | POA: Diagnosis not present

## 2021-11-02 DIAGNOSIS — I471 Supraventricular tachycardia: Secondary | ICD-10-CM | POA: Diagnosis not present

## 2021-12-06 ENCOUNTER — Ambulatory Visit: Payer: Medicare Other | Admitting: Obstetrics & Gynecology

## 2021-12-06 VITALS — BP 122/70 | Ht 67.0 in | Wt 184.0 lb

## 2021-12-06 DIAGNOSIS — Z4689 Encounter for fitting and adjustment of other specified devices: Secondary | ICD-10-CM

## 2021-12-06 DIAGNOSIS — N812 Incomplete uterovaginal prolapse: Secondary | ICD-10-CM

## 2021-12-06 NOTE — Progress Notes (Signed)
   Subjective:    Patient ID: Stephanie Morris, female    DOB: 12-Aug-1955, 66 y.o.   MRN: [id number]  HPI 66 yo married P3 is here to trial a #6 ring pessary to manage her cystocele, rectocele and uterine prolapse.   Review of Systems     Objective:   Physical Exam  Well nourished, well hydrated White female, no apparent distress She is ambulating and conversing normally. I placed the #6 ring into her vagina. It relieved the prolapse but was uncomfortable. I (finally) had a #5 sample ring and it fit perfectly- good support and no discomfort.      Assessment & Plan:  Prolapse- she will come in when the #5 ring with support has arrived I told her there will not be a co pay for that visit (although there will be a charge for the pessary itself).

## 2021-12-16 ENCOUNTER — Telehealth: Payer: Self-pay | Admitting: Obstetrics & Gynecology

## 2021-12-16 NOTE — Telephone Encounter (Signed)
-----   Message from [place], LPN sent at 0/76/2263 12:12 PM EDT ----- Regarding: Pessary Pessary arrived. Please schedule appointment for insertion at her convenience. ----- Message ----- From: Allie Bossier, MD Sent: 12/06/2021   4:04 PM EDT To: Rocco Serene, LPN  I can't remember if I sent you a message to order this lady a #5 ring with support. Let me know when it arrives please. Thanks

## 2021-12-16 NOTE — Telephone Encounter (Signed)
Pessary labeled/reserved for patient.

## 2021-12-16 NOTE — Telephone Encounter (Signed)
Patient is scheduled for 01/02/22 with [facility]

## 2022-01-02 ENCOUNTER — Ambulatory Visit (INDEPENDENT_AMBULATORY_CARE_PROVIDER_SITE_OTHER): Payer: Medicare Other | Admitting: Obstetrics & Gynecology

## 2022-01-02 ENCOUNTER — Encounter: Payer: Self-pay | Admitting: Obstetrics & Gynecology

## 2022-01-02 VITALS — BP 110/72 | Ht 67.0 in | Wt 182.0 lb

## 2022-01-02 DIAGNOSIS — N816 Rectocele: Secondary | ICD-10-CM

## 2022-01-02 DIAGNOSIS — K649 Unspecified hemorrhoids: Secondary | ICD-10-CM

## 2022-01-02 DIAGNOSIS — N812 Incomplete uterovaginal prolapse: Secondary | ICD-10-CM | POA: Diagnosis not present

## 2022-01-02 MED ORDER — HYDROCORTISONE ACETATE 25 MG RE SUPP: 25.0000 mg | Freq: Two times a day (BID) | RECTAL | 6 refills | Status: AC

## 2022-01-02 NOTE — Progress Notes (Signed)
   Established Patient Office Visit  Subjective   Patient ID: Stephanie Morris, female    DOB: 02/11/1956  Age: 66 y.o. MRN: [id number]  Chief Complaint  Patient presents with   Pessary Check    placement    HPI    66 yo marrid P3 is here to have a #5 ring pessary inserted. She is interested in learning how to manage it at home. This is to help with the prolapse of her uterus, bladder and rectum.  She is also having a flare of her hemorrhoids. She has never tried any prescriptions for this. Objective:     BP 110/72   Ht 5\' 7"  (1.702 m)   Wt 182 lb (82.6 kg)   BMI 28.51 kg/m    Physical Exam Well nourished, well hydrated White female, no apparent distress She is ambulating and conversing normally. Moderate hemorrhoids noted I placed the #5 ring with support. It relieved her prolapse symptoms and didn't cause any discomfort. She was able to remove and replace it.  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    Assessment & Plan:   Problem List Items Addressed This Visit   None  Symptomatic cystocele, rectocele, and uterine prolapse- she will use the #5 ring and remove it nightly. She will come back in a year or prn sooner Hemorrhoids- Anusol prescribed with refills     Emily Filbert, MD

## 2022-02-01 ENCOUNTER — Other Ambulatory Visit: Payer: Self-pay | Admitting: Internal Medicine

## 2022-02-01 DIAGNOSIS — Z1231 Encounter for screening mammogram for malignant neoplasm of breast: Secondary | ICD-10-CM

## 2022-05-29 ENCOUNTER — Ambulatory Visit
Admission: RE | Admit: 2022-05-29 | Discharge: 2022-05-29 | Disposition: A | Discharge status: Home / Self Care | Payer: Medicare Other | Source: Ambulatory Visit | Attending: Internal Medicine | Admitting: Internal Medicine

## 2022-05-29 DIAGNOSIS — Z1231 Encounter for screening mammogram for malignant neoplasm of breast: Secondary | ICD-10-CM | POA: Diagnosis present

## 2022-12-29 ENCOUNTER — Other Ambulatory Visit: Payer: Self-pay | Admitting: Orthopedic Surgery

## 2022-12-29 DIAGNOSIS — M2392 Unspecified internal derangement of left knee: Secondary | ICD-10-CM

## 2022-12-29 DIAGNOSIS — M1712 Unilateral primary osteoarthritis, left knee: Secondary | ICD-10-CM

## 2022-12-29 DIAGNOSIS — M25462 Effusion, left knee: Secondary | ICD-10-CM

## 2022-12-29 DIAGNOSIS — M2352 Chronic instability of knee, left knee: Secondary | ICD-10-CM

## 2023-01-08 ENCOUNTER — Encounter: Payer: Self-pay | Admitting: Orthopedic Surgery

## 2023-01-11 ENCOUNTER — Ambulatory Visit
Admission: RE | Admit: 2023-01-11 | Discharge: 2023-01-11 | Disposition: A | Discharge status: Home / Self Care | Payer: Medicare Other | Source: Ambulatory Visit | Attending: Orthopedic Surgery | Admitting: Orthopedic Surgery

## 2023-01-11 DIAGNOSIS — M2352 Chronic instability of knee, left knee: Secondary | ICD-10-CM

## 2023-01-11 DIAGNOSIS — M25462 Effusion, left knee: Secondary | ICD-10-CM

## 2023-01-11 DIAGNOSIS — M1712 Unilateral primary osteoarthritis, left knee: Secondary | ICD-10-CM

## 2023-01-11 DIAGNOSIS — M2392 Unspecified internal derangement of left knee: Secondary | ICD-10-CM

## 2023-04-27 ENCOUNTER — Other Ambulatory Visit: Payer: Self-pay | Admitting: Internal Medicine

## 2023-04-27 DIAGNOSIS — Z1231 Encounter for screening mammogram for malignant neoplasm of breast: Secondary | ICD-10-CM

## 2023-05-31 ENCOUNTER — Ambulatory Visit
Admission: RE | Admit: 2023-05-31 | Discharge: 2023-05-31 | Disposition: A | Discharge status: Home / Self Care | Payer: Medicare Other | Source: Ambulatory Visit | Attending: Internal Medicine | Admitting: Internal Medicine

## 2023-05-31 DIAGNOSIS — Z1231 Encounter for screening mammogram for malignant neoplasm of breast: Secondary | ICD-10-CM | POA: Diagnosis present

## 2024-02-25 ENCOUNTER — Ambulatory Visit: Payer: Self-pay

## 2024-02-25 DIAGNOSIS — K449 Diaphragmatic hernia without obstruction or gangrene: Secondary | ICD-10-CM | POA: Diagnosis not present

## 2024-02-25 DIAGNOSIS — K2100 Gastro-esophageal reflux disease with esophagitis, without bleeding: Secondary | ICD-10-CM | POA: Diagnosis not present

## 2024-02-25 DIAGNOSIS — R1319 Other dysphagia: Secondary | ICD-10-CM | POA: Diagnosis present

## 2024-02-25 DIAGNOSIS — K222 Esophageal obstruction: Secondary | ICD-10-CM | POA: Diagnosis not present

## 2024-04-16 ENCOUNTER — Other Ambulatory Visit: Payer: Self-pay | Admitting: Obstetrics and Gynecology

## 2024-04-16 DIAGNOSIS — Z1231 Encounter for screening mammogram for malignant neoplasm of breast: Secondary | ICD-10-CM

## 2024-06-02 ENCOUNTER — Encounter
